# Patient Record
Sex: Female | Born: 1985 | Race: White | Hispanic: No | Marital: Married | Smoking: Current every day smoker
Health system: Southern US, Community
[De-identification: ages and names within clinical notes are randomized; demographics above are authoritative.]

---

## 2019-03-09 ENCOUNTER — Encounter: Payer: Self-pay | Admitting: Emergency Medicine

## 2019-03-09 ENCOUNTER — Emergency Department (INDEPENDENT_AMBULATORY_CARE_PROVIDER_SITE_OTHER): Payer: BC Managed Care – PPO

## 2019-03-09 ENCOUNTER — Other Ambulatory Visit: Payer: Self-pay

## 2019-03-09 ENCOUNTER — Emergency Department (INDEPENDENT_AMBULATORY_CARE_PROVIDER_SITE_OTHER)
Admission: EM | Admit: 2019-03-09 | Discharge: 2019-03-09 | Disposition: A | Discharge status: Home / Self Care | Payer: BC Managed Care – PPO | Source: Home / Self Care | Attending: Family Medicine | Admitting: Family Medicine

## 2019-03-09 DIAGNOSIS — M5440 Lumbago with sciatica, unspecified side: Secondary | ICD-10-CM

## 2019-03-09 DIAGNOSIS — M5416 Radiculopathy, lumbar region: Secondary | ICD-10-CM | POA: Diagnosis not present

## 2019-03-09 LAB — POCT URINALYSIS DIP (MANUAL ENTRY)
Bilirubin, UA: NEGATIVE
Blood, UA: NEGATIVE
Glucose, UA: NEGATIVE mg/dL
Ketones, POC UA: NEGATIVE mg/dL
Leukocytes, UA: NEGATIVE
Nitrite, UA: NEGATIVE
Protein Ur, POC: NEGATIVE mg/dL
Spec Grav, UA: >=1.03 — AB (ref 1.010–1.025)
Urobilinogen, UA: 0.2 E.U./dL — AB
pH, UA: 7 (ref 5.0–8.0)

## 2019-03-09 MED ORDER — PREDNISONE 20 MG PO TABS: ORAL_TABLET | ORAL | 0 refills | Status: AC

## 2019-03-09 MED ORDER — TRAMADOL HCL 50 MG PO TABS: ORAL_TABLET | ORAL | 0 refills | Status: AC

## 2019-03-09 MED ORDER — CYCLOBENZAPRINE HCL 10 MG PO TABS: 10.0000 mg | ORAL_TABLET | Freq: Two times a day (BID) | ORAL | 0 refills | Status: AC | PRN

## 2019-03-09 NOTE — Discharge Instructions (Addendum)
Apply ice pack for 20 to 30 minutes, 3 to 4 times daily  Continue until pain decreases.  °Begin range of motion and stretching exercises as tolerated. °

## 2019-03-09 NOTE — ED Triage Notes (Signed)
Patient reports sudden onset of bilateral low back pain last night; cannot associate any activity that would cause it. She has had her influenza vacc this season. She ha not travelled nor been in contact with covid positive person.

## 2019-03-09 NOTE — ED Provider Notes (Signed)
[facility]    CSN: [id number] Arrival date & time: 03/09/19  1051      History   Chief Complaint Chief Complaint  Patient presents with   Back Pain    HPI Angie Castro is a 33 y.o. female.   While climbing out of her car last night, patient felt sudden sharp midline low back pain that has persisted and now radiates to her lateral thighs, worse on the right.  She denies bowel or bladder dysfunction, and no saddle numbness.  The pain is worse when standing and supine on her back.  The pain improves somewhat when she is sitting.  Patient's last menstrual period was 02/27/2019 (approximate).   The history is provided by the patient.  Back Pain Location:  Lumbar spine Quality:  Stabbing and burning Radiates to:  R thigh and L thigh Pain severity:  Moderate Pain is:  Same all the time Onset quality:  Sudden Duration:  1 day Timing:  Constant Progression:  Unchanged Context: twisting   Context: not lifting heavy objects, not occupational injury and not physical stress   Relieved by:  Nothing Worsened by:  Bending and lying down Ineffective treatments:  NSAIDs Associated symptoms: no abdominal pain, no bladder incontinence, no bowel incontinence, no dysuria, no fever, no headaches, no leg pain, no numbness, no paresthesias, no pelvic pain, no perianal numbness, no tingling, no weakness and no weight loss   Risk factors: obesity     History reviewed. No pertinent past medical history.  There are no active problems to display for this patient.   Past Surgical History:  Procedure Laterality Date   CESAREAN SECTION      OB History   No obstetric history on file.      Home Medications    Prior to Admission medications   Medication Sig Start Date End Date Taking? Authorizing Provider  cyclobenzaprine (FLEXERIL) 10 MG tablet Take 1 tablet (10 mg total) by mouth 2 (two) times daily as needed for muscle spasms. 03/09/19   Lattie HawBeese, Teyonna Plaisted A, MD  predniSONE  (DELTASONE) 20 MG tablet Take one tab by mouth twice daily for 4 days, then one daily. Take with food. 03/09/19   Lattie HawBeese, Foxx Klarich A, MD  traMADol Janean Sark(ULTRAM) 50 MG tablet Take one tab PO HS PRN pain 03/09/19   Lattie HawBeese, Antuane Eastridge A, MD    Family History No family history on file.  Social History Social History   Tobacco Use   Smoking status: Current Every Day Smoker   Smokeless tobacco: Never Used  Substance Use Topics   Alcohol use: Yes   Drug use: Not on file     Allergies   Augmentin [amoxicillin-pot clavulanate] and Sulfa antibiotics   Review of Systems Review of Systems  Constitutional: Negative for chills, diaphoresis, fatigue, fever and weight loss.  Gastrointestinal: Negative for abdominal pain and bowel incontinence.  Genitourinary: Negative for bladder incontinence, dysuria, flank pain, frequency, hematuria and pelvic pain.  Musculoskeletal: Positive for back pain.  Neurological: Negative for tingling, weakness, numbness, headaches and paresthesias.  All other systems reviewed and are negative.    Physical Exam Triage Vital Signs ED Triage Vitals  Enc Vitals Group     BP 03/09/19 1235 115/73     Pulse Rate 03/09/19 1235 68     Resp 03/09/19 1235 18     Temp 03/09/19 1235 98 F (36.7 C)     Temp Source 03/09/19 1235 Oral     SpO2 03/09/19 1235 98 %  Weight 03/09/19 1236 260 lb (117.9 kg)     Height 03/09/19 1236 5\' 5"  (1.651 m)     Head Circumference --      Peak Flow --      Pain Score 03/09/19 1236 7     Pain Loc --      Pain Edu? --      Excl. in [place]? --    No data found.  Updated Vital Signs BP 115/73 (BP Location: Right Arm)    Pulse 68    Temp 98 F (36.7 C) (Oral)    Resp 18    Ht 5\' 5"  (1.651 m)    Wt 117.9 kg    LMP 02/27/2019 (Approximate)    SpO2 98%    BMI 43.27 kg/m   Visual Acuity Right Eye Distance:   Left Eye Distance:   Bilateral Distance:    Right Eye Near:   Left Eye Near:    Bilateral Near:     Physical Exam Vitals signs  and nursing note reviewed.  Constitutional:      General: She is not in acute distress.    Appearance: She is obese.  HENT:     Head: Normocephalic.     Right Ear: External ear normal.     Left Ear: External ear normal.     Nose: Nose normal.     Mouth/Throat:     Pharynx: Oropharynx is clear.  Eyes:     Conjunctiva/sclera: Conjunctivae normal.     Pupils: Pupils are equal, round, and reactive to light.  Neck:     Musculoskeletal: Normal range of motion.  Cardiovascular:     Heart sounds: Normal heart sounds.  Pulmonary:     Breath sounds: Normal breath sounds.  Abdominal:     Palpations: Abdomen is soft.     Tenderness: There is no abdominal tenderness.  Musculoskeletal:       Back:     Comments: Back:   Can heel/toe walk and squat without difficulty.  Decreased forward flexion.  Tenderness in the midline from L3 to L4.  There is bilateral paraspinous tenderness to palpation, worse on the left beginning at about L2.  Straight leg raising test is negative.  Sitting knee extension test is negative.  Strength and sensation in the lower extremities is normal.  Patellar and achilles reflexes are normal     Skin:    General: Skin is warm and dry.     Findings: No rash.  Neurological:     Mental Status: She is alert.      UC Treatments / Results  Labs (all labs ordered are listed, but only abnormal results are displayed) Labs Reviewed  POCT URINALYSIS DIP (MANUAL ENTRY) - Abnormal; Notable for the following components:      Result Value   Spec Grav, UA >=1.030 (*)    Urobilinogen, UA 0.2 (*)    All other components within normal limits    EKG   Radiology Dg Lumbar Spine Complete  Result Date: 03/09/2019 CLINICAL DATA:  Sudden onset midline lower back pain with hip radiation EXAM: LUMBAR SPINE - COMPLETE 4+ VIEW COMPARISON:  None. FINDINGS: Five lumbar type vertebral bodies, normally formed. Mild levocurvature with an apex at L3, possibly positional. No traumatic  listhesis or spondylolysis. No acute fracture or vertebral body height loss. Minimal discogenic change is noted in the lumbar spine most pronounced at T12-L1, L2-L3, and L5-S1. included portions of the pelvis and soft tissues are unremarkable. IMPRESSION: 1. No  acute osseous abnormality. 2. Minimal discogenic changes in the lumbar spine. Electronically Signed   By: Kreg Shropshire M.D.   On: 03/09/2019 14:40    Procedures Procedures (including critical care time)  Medications Ordered in UC Medications - No data to display  Initial Impression / Assessment and Plan / UC Course  I have reviewed the triage vital signs and the nursing notes.  Pertinent labs & imaging results that were available during my care of the patient were reviewed by me and considered in my medical decision making (see chart for details).    Begin prednisone burst/taper.  Rx for Flexeril. Rx for Tramadol 50mg  HS PRN (#5, no refill) Followup with Dr. ([facility]) if not improving about two weeks.    Final Clinical Impressions(s) / UC Diagnoses   Final diagnoses:  Acute bilateral low back pain with sciatica, sciatica laterality unspecified     Discharge Instructions     Apply ice pack for 20 to 30 minutes, 3 to 4 times daily  Continue until pain decreases.  Begin range of motion and stretching exercises as tolerated.       ED Prescriptions    Medication Sig Dispense Auth. Provider   predniSONE (DELTASONE) 20 MG tablet Take one tab by mouth twice daily for 4 days, then one daily. Take with food. 12 tablet Rodney Langton, MD   traMADol (ULTRAM) 50 MG tablet Take one tab PO HS PRN pain 5 tablet Lattie Haw, MD   cyclobenzaprine (FLEXERIL) 10 MG tablet Take 1 tablet (10 mg total) by mouth 2 (two) times daily as needed for muscle spasms. 10 tablet Lattie Haw, MD        Lattie Haw, MD 03/15/19 [phone]

## 2020-11-12 IMAGING — DX DG LUMBAR SPINE COMPLETE 4+V
5 series · 5 of 5 positions shown · non-contrast
Comparison: None.

CLINICAL DATA: Sudden onset midline lower back pain with hip
radiation

EXAM:
LUMBAR SPINE - COMPLETE 4+ VIEW

[l-spine ap]
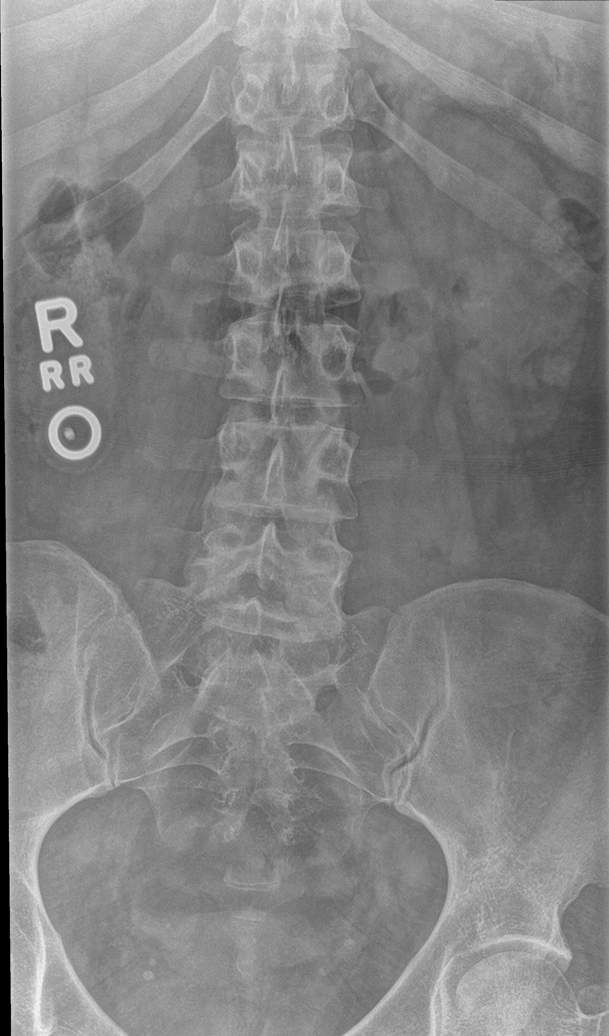

[l-spine obl (1 of 2)]
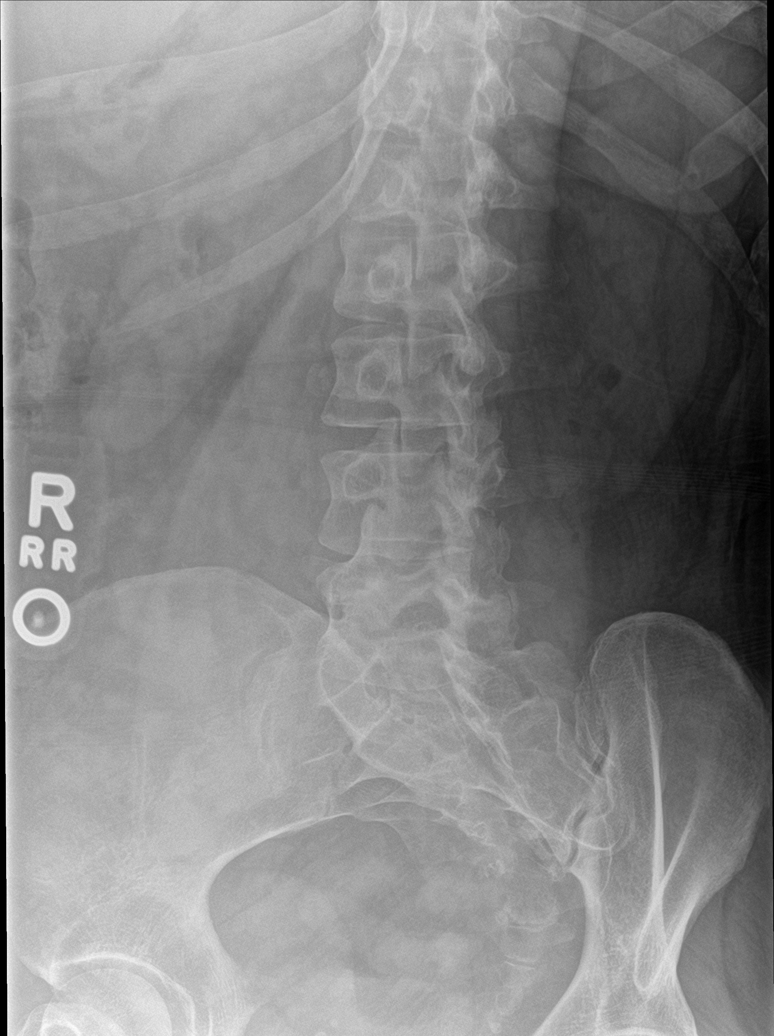

[l-spine obl (2 of 2)]
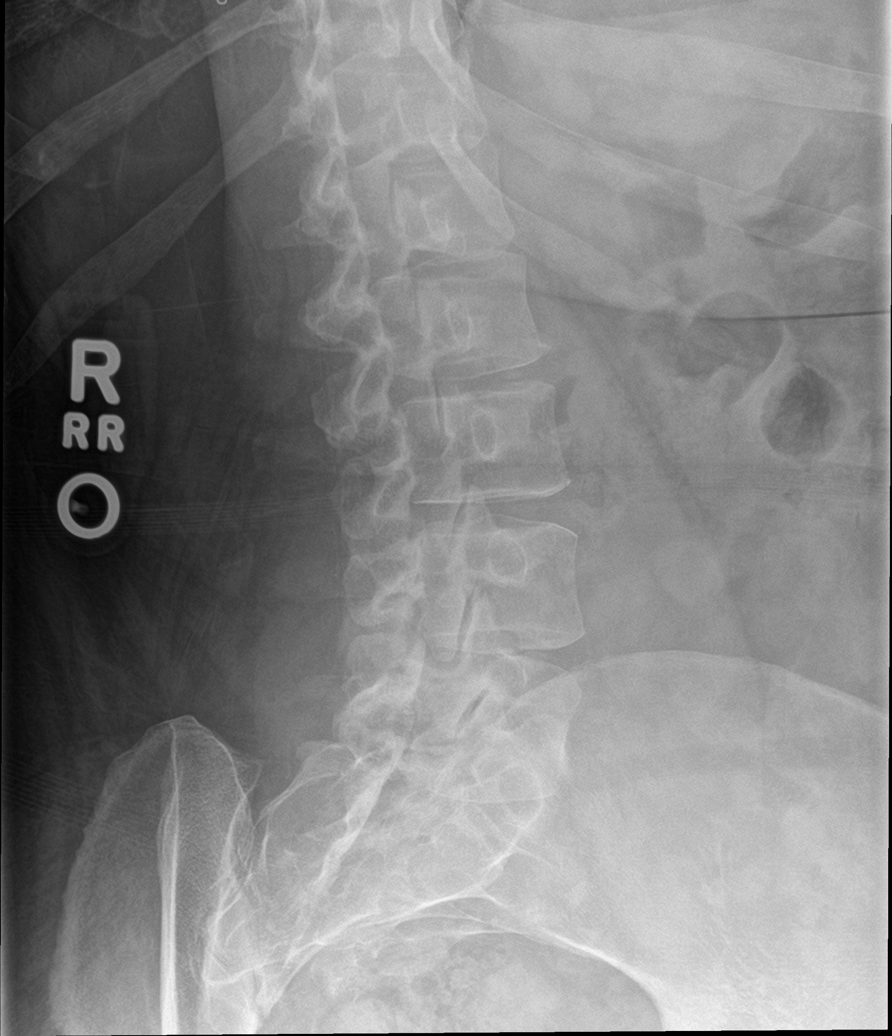

[l-spine lat]
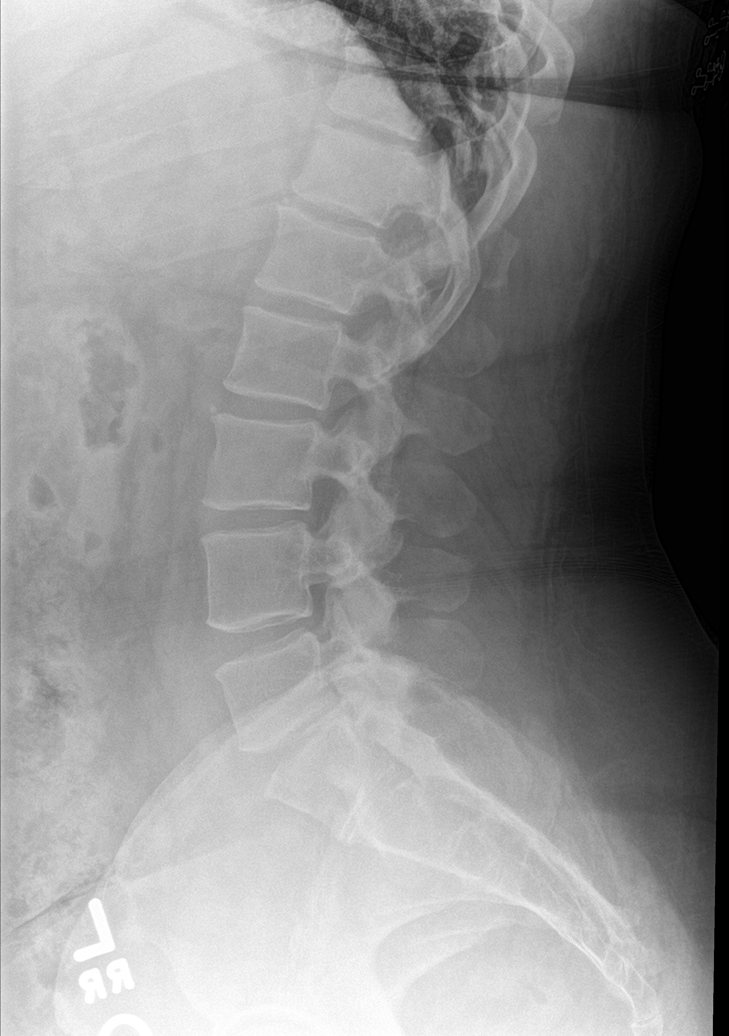

[l-spine spot]
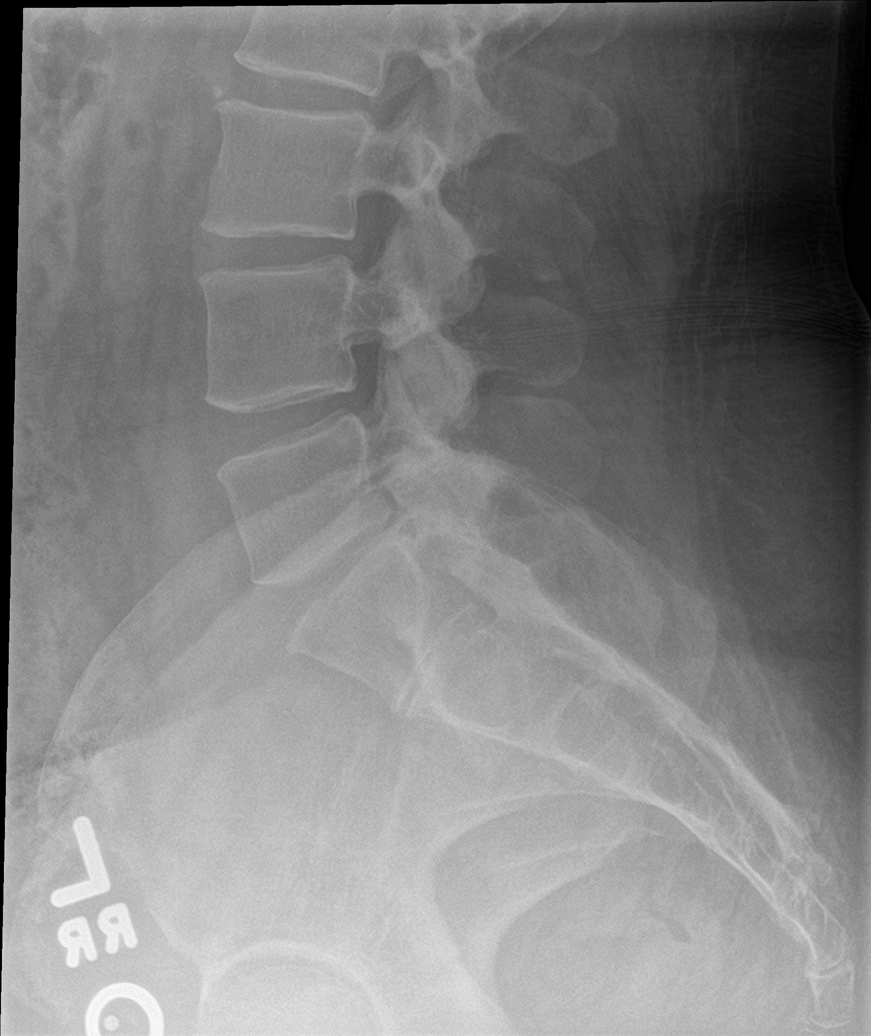

[5 of 5 positions shown; findings below may reference images not displayed]

FINDINGS: Five lumbar type vertebral bodies, normally formed. Mild
levocurvature with an apex at L3, possibly positional. No traumatic
listhesis or spondylolysis. No acute fracture or vertebral body
height loss. Minimal discogenic change is noted in the lumbar spine
most pronounced at T12-L1, L2-L3, and L5-S1. included portions of
the pelvis and soft tissues are unremarkable.
IMPRESSION: 1. No acute osseous abnormality.
2. Minimal discogenic changes in the lumbar spine.
